# Patient Record
Sex: Male | Born: 1975 | Hispanic: No | Marital: Single | Smoking: Current every day smoker
Health system: Southern US, Community
[De-identification: ages and names within clinical notes are randomized; demographics above are authoritative.]

---

## 2007-11-20 ENCOUNTER — Emergency Department: Payer: Self-pay | Admitting: Emergency Medicine

## 2015-08-16 ENCOUNTER — Emergency Department: Payer: Self-pay

## 2015-08-16 ENCOUNTER — Emergency Department
Admission: EM | Admit: 2015-08-16 | Discharge: 2015-08-16 | Disposition: A | Discharge status: Home / Self Care | Payer: Self-pay | Attending: Emergency Medicine | Admitting: Emergency Medicine

## 2015-08-16 DIAGNOSIS — W228XXA Striking against or struck by other objects, initial encounter: Secondary | ICD-10-CM | POA: Insufficient documentation

## 2015-08-16 DIAGNOSIS — Y9364 Activity, baseball: Secondary | ICD-10-CM | POA: Insufficient documentation

## 2015-08-16 DIAGNOSIS — Y92310 Basketball court as the place of occurrence of the external cause: Secondary | ICD-10-CM | POA: Insufficient documentation

## 2015-08-16 DIAGNOSIS — Y998 Other external cause status: Secondary | ICD-10-CM | POA: Insufficient documentation

## 2015-08-16 DIAGNOSIS — S43101A Unspecified dislocation of right acromioclavicular joint, initial encounter: Secondary | ICD-10-CM | POA: Insufficient documentation

## 2015-08-16 MED ORDER — OXYCODONE-ACETAMINOPHEN 5-325 MG PO TABS: 1.0000 | ORAL_TABLET | ORAL | Status: DC | PRN

## 2015-08-16 MED ORDER — OXYCODONE-ACETAMINOPHEN 5-325 MG PO TABS
1.0000 | ORAL_TABLET | Freq: Once | ORAL | Status: AC
  Administered 2015-08-16: 1 via ORAL
  Filled 2015-08-16: qty 1

## 2015-08-16 NOTE — ED Provider Notes (Signed)
[facility] Emergency Department Provider Note  ____________________________________________  Time seen: Approximately 5:37 PM  I have reviewed the triage vital signs and the nursing notes.   HISTORY  Chief Complaint Shoulder Injury   HPI Grant Burton is a 40 y.o. male chief complaint of right shoulder pain. Patient was playing softball and felt himself losing his balance and rolled hitting his right shoulder on the ground. He states at that time he felt a pop and has felt abnormal sense. Patient has had decreased range of motion in his right shoulder but is able to move his fingers and denies paresthesias. He denies any head injury or loss consciousness. He has not taken any medication prior to his arrival in the emergency room. Currently he rates his pain as an 8 out of 10.  No past medical history on file.  There are no active problems to display for this patient.   No past surgical history on file.  Current Outpatient Rx  Name  Route  Sig  Dispense  Refill  . oxyCODONE-acetaminophen (PERCOCET) 5-325 MG tablet   Oral   Take 1-2 tablets by mouth every 4 (four) hours as needed for severe pain.   30 tablet   0     Allergies Review of patient's allergies indicates no known allergies.  No family history on file.  Social History Social History  Substance Use Topics  . Smoking status: Not on file  . Smokeless tobacco: Not on file  . Alcohol Use: Not on file    Review of Systems Constitutional: No fever/chills ENT: No trauma Cardiovascular: Denies chest pain. Respiratory: Denies shortness of breath. Gastrointestinal:   No nausea, no vomiting.   Musculoskeletal: Positive for right shoulder pain. Skin: Negative for rash. Neurological: Negative for headaches, focal weakness or numbness.  10-point ROS otherwise negative.  ____________________________________________   PHYSICAL EXAM:  VITAL SIGNS: ED Triage Vitals  Enc Vitals Group    BP 08/16/15 1651 148/81 mmHg     Pulse Rate 08/16/15 1651 93     Resp 08/16/15 1651 18     Temp 08/16/15 1651 98 F (36.7 C)     Temp Source 08/16/15 1651 Oral     SpO2 08/16/15 1651 96 %     Weight 08/16/15 1651 209 lb (94.802 kg)     Height 08/16/15 1651 5\' 7"  (1.702 m)     Head Cir --      Peak Flow --      Pain Score 08/16/15 1652 8     Pain Loc --      Pain Edu? --      Excl. in GC? --     Constitutional: Alert and oriented. Well appearing and in no acute distress. Eyes: Conjunctivae are normal. PERRL. EOMI. Head: Atraumatic. Nose: No congestion/rhinnorhea. Neck: No stridor.   Cardiovascular: Normal rate, regular rhythm. Grossly normal heart sounds.  Good peripheral circulation. Respiratory: Normal respiratory effort.  No retractions. Lungs CTAB. Musculoskeletal: Right shoulder there is some soft tissue swelling and moderate tenderness on palpation at the [facility].  Range of motion is restricted secondary to pain. Motor sensory function distal to the injury is within normal limits. Neurologic:  Normal speech and language. No gross focal neurologic deficits are appreciated. No gait instability. Skin:  Skin is warm, dry and intact. No rash noted. No ecchymosis, abrasions, erythema was noted to the right shoulder. Psychiatric: Mood and affect are normal. Speech and behavior are normal.  ____________________________________________   LABS (  all labs ordered are listed, but only abnormal results are displayed)  Labs Reviewed - No data to display  RADIOLOGY  Right shoulder per radiologist shows type III before meals joint separation. ____________________________________________   PROCEDURES  Procedure(s) performed: None  Critical Care performed: No  ____________________________________________   INITIAL IMPRESSION / ASSESSMENT AND PLAN / ED COURSE  Pertinent labs & imaging results that were available during my care of the patient were reviewed by me and considered  in my medical decision making (see chart for details).  Patient was given Percocet while in the emergency room and a prescription for the same. He opted to wear a sling rather than a shoulder immobilizer. He was instructed to call Dr. Binnie Rail office tomorrow for an appointment. Ice and elevation. ____________________________________________   FINAL CLINICAL IMPRESSION(S) / ED DIAGNOSES  Final diagnoses:  AC separation, type 3, right, initial encounter      Tommi Rumps, PA-C 08/16/15 1827  Phineas Semen, MD 08/16/15 2010

## 2015-08-16 NOTE — Discharge Instructions (Signed)
Shoulder Separation A shoulder separation (acromioclavicular separation) is an injury to the connecting tissue (ligament) between the top of your shoulder blade (acromion) and your collarbone (clavicle). The ligament may be stretched, partially torn, or completely torn.  A stretched ligament may not cause very much pain, and it does not move the collarbone out of place. A stretched ligament looks normal on an X-ray.  An injury that is a bit worse may partially tear a ligament and move the collarbone slightly out of place.  A serious injury completely tears both shoulder ligaments. This moves the collarbone severely out of position and changes the way that the shoulder looks (deformity). CAUSES The most common cause of a shoulder separation is falling on or receiving a blow to the top of the shoulder. Falling with an outstretched arm may also cause this injury. RISK FACTORS You may be at greater risk of a shoulder separation if:  You are male.  You are younger than age 72.  You play a contact sport, such as football or hockey. SIGNS AND SYMPTOMS The most common symptom of a shoulder separation is pain on the top of the shoulder after falling on it or receiving a blow to it. Other signs and symptoms include:  Shoulder deformity.  Swelling of the shoulder.  Decreased ability to move the shoulder.  Bruising on top of the shoulder. DIAGNOSIS Your health care provider may suspect a shoulder separation based on your symptoms and the details of a recent injury. A physical exam will be done. During this exam, the health care provider may:  Press on your shoulder.  Test the movement of your shoulder.  Ask you to hold a weight in your hand to see if the separation increases.  Do an X-ray. TREATMENT  A stretch injury may require only a sling, pain medicine, and cold packs. This treatment may last for 2-12 weeks. You may also have physical therapy. A physical therapist will teach you to  do daily exercises to strengthen your shoulder muscles and prevent stiffness.  A complete tear may require surgery to repair the torn ligament. After surgery, you will also require a sling, pain medicine, and cold packs. Recovery may take longer. You may also need more physical therapy. HOME CARE INSTRUCTIONS  Take medicines only as directed by your health care provider.  Apply ice to the top of your shoulder:  Put ice in a plastic bag.  Place a towel between your skin and the bag.  Leave the ice on for 20 minutes, 2-3 times a day.  Wear your sling or splint as directed by your health care provider.  You may be able to remove your sling to do your physical therapy exercises.  Ask your health care provider when you can stop wearing the sling.  Do not do any activities that make your pain worse.  Do not lift anything that is heavier than 10 lb (4.5 kg) on the injured side of your body.  Ask your health care provider when you can return to athletic activities. SEEK MEDICAL CARE IF:  Your pain medicine is not relieving your pain.  Your pain and stiffness are not improving after 2 weeks.  You are unable to do your physical therapy exercises because of pain or stiffness.   This information is not intended to replace advice given to you by your health care provider. Make sure you discuss any questions you have with your health care provider.   Document Released: 02/23/2005 Document Revised: 06/06/2014  Document Reviewed: 10/16/2013 Elsevier Interactive Patient Education [employer]   Follow-up with Dr. Joice LoftsPoggi. Call the office tomorrow for appointment information. Ice and elevate, wear sling for support. Percocet as needed for pain as directed.

## 2015-08-16 NOTE — ED Notes (Signed)
Pt states was playing softball and fell rolling on the rt shoulder, pt thinks that his rt shoulder may be out of joint, pt states that the shoulder feels abnormal, radial pulse palpable, pt able to move fingers and has decreased movement to the rt shoulder

## 2015-08-16 NOTE — ED Notes (Signed)
Pt refused pain medication, pt accepted ice pack

## 2018-05-10 ENCOUNTER — Emergency Department
Admission: EM | Admit: 2018-05-10 | Discharge: 2018-05-10 | Disposition: A | Discharge status: Home / Self Care | Payer: 59 | Attending: Emergency Medicine | Admitting: Emergency Medicine

## 2018-05-10 ENCOUNTER — Encounter: Payer: Self-pay | Admitting: Emergency Medicine

## 2018-05-10 ENCOUNTER — Emergency Department: Payer: 59

## 2018-05-10 ENCOUNTER — Other Ambulatory Visit: Payer: Self-pay

## 2018-05-10 DIAGNOSIS — S0083XA Contusion of other part of head, initial encounter: Secondary | ICD-10-CM | POA: Insufficient documentation

## 2018-05-10 DIAGNOSIS — F17210 Nicotine dependence, cigarettes, uncomplicated: Secondary | ICD-10-CM | POA: Insufficient documentation

## 2018-05-10 DIAGNOSIS — S0990XA Unspecified injury of head, initial encounter: Secondary | ICD-10-CM | POA: Diagnosis present

## 2018-05-10 DIAGNOSIS — Y999 Unspecified external cause status: Secondary | ICD-10-CM | POA: Diagnosis not present

## 2018-05-10 DIAGNOSIS — W19XXXA Unspecified fall, initial encounter: Secondary | ICD-10-CM | POA: Insufficient documentation

## 2018-05-10 DIAGNOSIS — Y929 Unspecified place or not applicable: Secondary | ICD-10-CM | POA: Diagnosis not present

## 2018-05-10 DIAGNOSIS — Y939 Activity, unspecified: Secondary | ICD-10-CM | POA: Insufficient documentation

## 2018-05-10 DIAGNOSIS — R55 Syncope and collapse: Secondary | ICD-10-CM | POA: Insufficient documentation

## 2018-05-10 DIAGNOSIS — T07XXXA Unspecified multiple injuries, initial encounter: Secondary | ICD-10-CM

## 2018-05-10 NOTE — ED Notes (Signed)
See triage note  States he thinks he passed out after coughing hard  Hit his head on cement

## 2018-05-10 NOTE — Discharge Instructions (Addendum)
[place] [facility] [place] [facility] [place] if any continued problems.  Continue trying to decrease the amount of smoking that you are doing.  You may also try medications like Mucinex DM as needed for cough as this will help loosen the secretions that you are trying to cough up.  Drink lots of fluids.

## 2018-05-10 NOTE — ED Provider Notes (Signed)
[facility] Emergency Department Provider Note   ____________________________________________   None    (approximate)  I have reviewed the triage vital signs and the nursing notes.   HISTORY  Chief Complaint Loss of Consciousness and Cough   HPI Grant Burton is a 42 y.o. male presents to the ED for evaluation of a minor head injury.  Patient states that he was coughing very hard as he has a smoker.  He states that during this time he passed out hitting his head on concrete.  This was observed by his supervisor while he was at work.  Patient states that each morning he coughs like this because he is a smoker.  He denies any fever, chills, nausea or vomiting.  There is been no change in his vision.  He also has several abrasions to his face.  His pain is 2/10.   History reviewed. No pertinent past medical history.  There are no active problems to display for this patient.   History reviewed. No pertinent surgical history.  Prior to Admission medications   Not on File    Allergies Patient has no known allergies.  No family history on file.  Social History Social History   Tobacco Use  . Smoking status: Current Every Day Smoker    Packs/day: 1.00    Types: Cigarettes  . Smokeless tobacco: Never Used  Substance Use Topics  . Alcohol use: Never    Frequency: Never  . Drug use: Not on file    Review of Systems Constitutional: No fever/chills Eyes: No visual changes. ENT: Negative for nasal congestion, sore throat, nasal pain. Cardiovascular: Denies chest pain. Respiratory: Denies shortness of breath.  Positive for nonproductive cough. Gastrointestinal: No abdominal pain.  No nausea, no vomiting.  Musculoskeletal: Negative for back pain. Skin: Positive for multiple abrasions to face. Neurological: Negative for headaches, focal weakness or numbness. ____________________________________________   PHYSICAL EXAM:  VITAL SIGNS: ED  Triage Vitals  Enc Vitals Group     BP 05/10/18 1111 (!) 149/87     Pulse Rate 05/10/18 1111 73     Resp 05/10/18 1111 16     Temp 05/10/18 1111 97.9 F (36.6 C)     Temp Source 05/10/18 1111 Oral     SpO2 05/10/18 1111 93 %     Weight 05/10/18 1059 209 lb (94.8 kg)     Height 05/10/18 1059 5\' 5"  (1.651 m)     Head Circumference --      Peak Flow --      Pain Score 05/10/18 1059 2     Pain Loc --      Pain Edu? --      Excl. in GC? --    Constitutional: Alert and oriented. Well appearing and in no acute distress. Eyes: Conjunctivae are normal. PERRL. EOMI. Head: Atraumatic. Nose: No trauma. Mouth/Throat: Mucous membranes are moist.  Oropharynx non-erythematous.  No dental injury. Neck: No stridor.  No point tenderness on palpation of cervical spine posteriorly. Cardiovascular: Normal rate, regular rhythm. Grossly normal heart sounds.  Good peripheral circulation. Respiratory: Normal respiratory effort.  No retractions. Lungs CTAB. Musculoskeletal: No injury to the upper or lower extremities.  Nontender to palpation.  Patient is ambulatory without any assistance and normal gait was noted. Neurologic:  Normal speech and language. No gross focal neurologic deficits are appreciated. No gait instability. Skin:  Skin is warm, dry.  There are multiple superficial abrasions noted to the left side of his face  and forehead.  No foreign body is noted.  No active bleeding is present. Psychiatric: Mood and affect are normal. Speech and behavior are normal.  ____________________________________________   LABS (all labs ordered are listed, but only abnormal results are displayed)  Labs Reviewed - No data to display RADIOLOGY  Official radiology report(s): Ct Head Wo Contrast  Result Date: 05/10/2018 CLINICAL DATA:  Syncopal episode today with a fall and blow to the head. EXAM: CT HEAD WITHOUT CONTRAST TECHNIQUE: Contiguous axial images were obtained from the base of the skull through the  vertex without intravenous contrast. COMPARISON:  Head CT scan 11/20/2007. FINDINGS: Brain: No evidence of acute infarction, hemorrhage, hydrocephalus, extra-axial collection or mass lesion/mass effect. Vascular: No hyperdense vessel or unexpected calcification. Skull: Intact.  No focal lesion. Sinuses/Orbits: Minimal mucosal thickening periphery of the left maxillary sinus noted. Otherwise negative. Other: None. IMPRESSION: Negative head CT. Electronically Signed   By: Drusilla Kanner M.D.   On: 05/10/2018 11:22    ____________________________________________   PROCEDURES  Procedure(s) performed: None  Procedures  Critical Care performed: No  ____________________________________________   INITIAL IMPRESSION / ASSESSMENT AND PLAN / ED COURSE  As part of my medical decision making, I reviewed the following data within the electronic MEDICAL RECORD NUMBER Notes from prior ED visits and [place] Controlled Substance Database  Patient presents to the ED valuation of his head injury.  Patient states that he was coughing as he does each morning and reportedly passed out.  Patient states that he has not had any respiratory issues but continues to cough secondary to his cigarette smoking.  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  CT scan was negative and patient was made aware.  He is also instructed to clean the areas on his face with mild soap and water and watch for any signs of infection.  Patient requested a note to be able to return to work.  ____________________________________________   FINAL CLINICAL IMPRESSION(S) / ED DIAGNOSES  Final diagnoses:  Contusion of face, initial encounter  Multiple abrasions  Vasovagal syncope     ED Discharge Orders    None       Note:  This document was prepared using Dragon voice recognition software and may include unintentional dictation errors.    Tommi Rumps, PA-C 05/10/18 1637    Schaevitz, Myra Rude,  MD 05/11/18 [phone]

## 2018-05-10 NOTE — ED Triage Notes (Signed)
Pt here with c/o coughing so hard at work he passed out, hit his head on the concrete, knot noted to left temple, abrasion and bruise/swelling noted beside left eye, denies any trouble with vision. Pain at impact, no headache at this time, denies blood thinners.

## 2019-11-02 IMAGING — CT CT HEAD W/O CM
3 series · 16 of 47 positions shown, 19 images · non-contrast
Comparison: Head CT scan 11/20/2007.

CLINICAL DATA: Syncopal episode today with a fall and blow to the
head.

EXAM:
CT HEAD WITHOUT CONTRAST
TECHNIQUE: Contiguous axial images were obtained from the base of the skull
through the vertex without intravenous contrast.

[Series 3: head wo · axial · 0.41mm/px · z∈[-108,+17]mm · 10 of 30 slices shown, 13 images]
[im 3/30  brain]
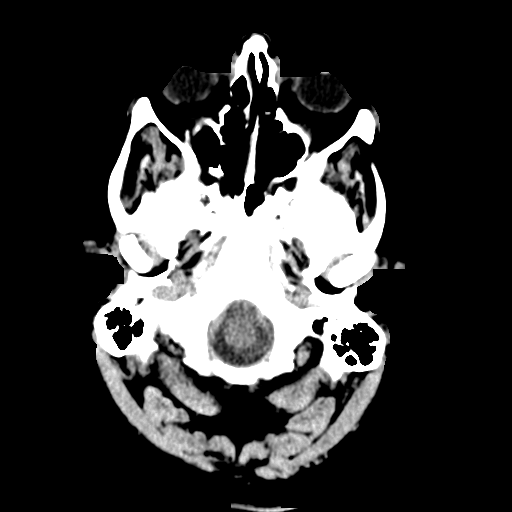
[im 3/30  bone]
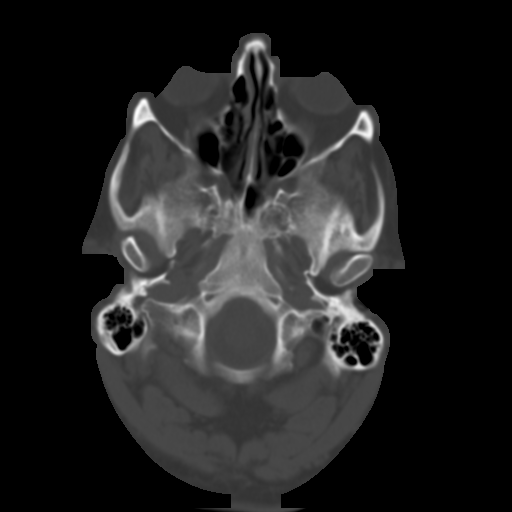
[im 6/30  brain]
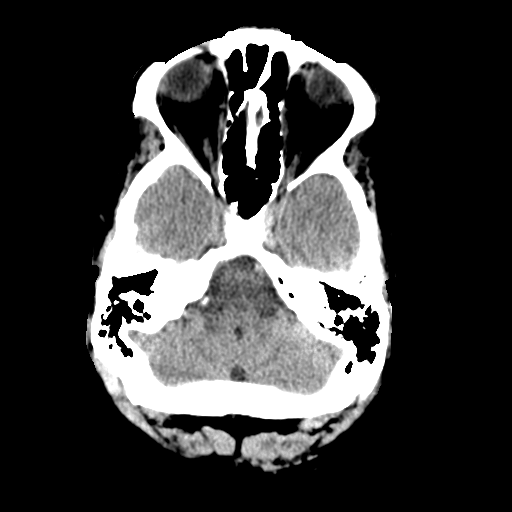
[im 9/30  brain]
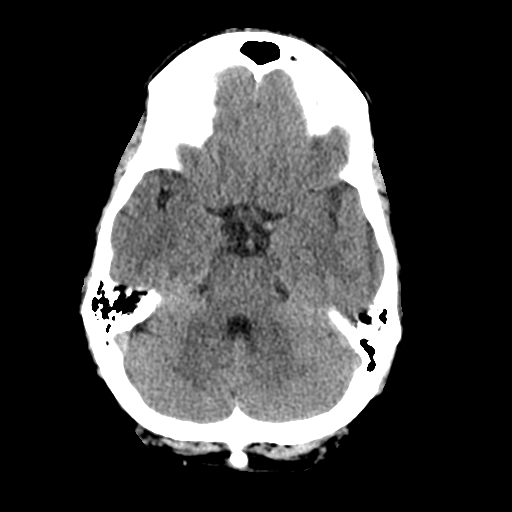
[im 11/30  brain]
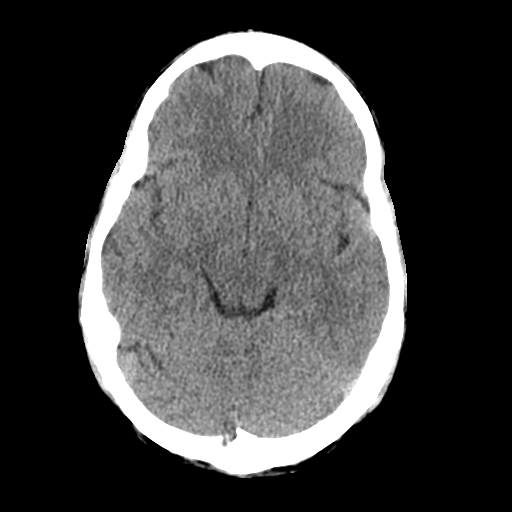
[im 14/30  brain]
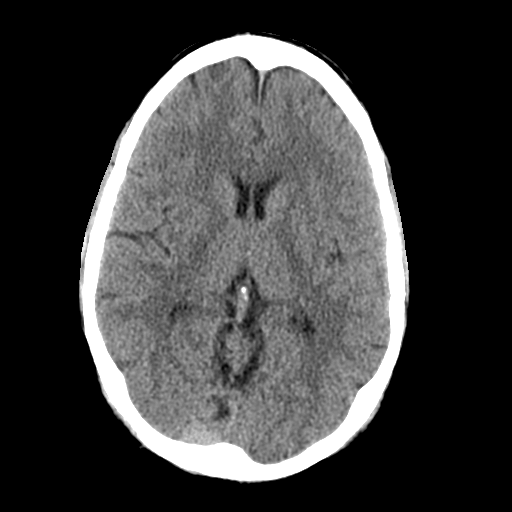
[im 14/30  bone]
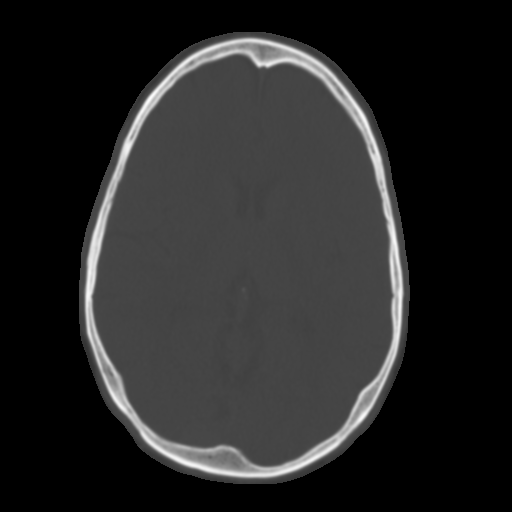
[im 17/30  brain]
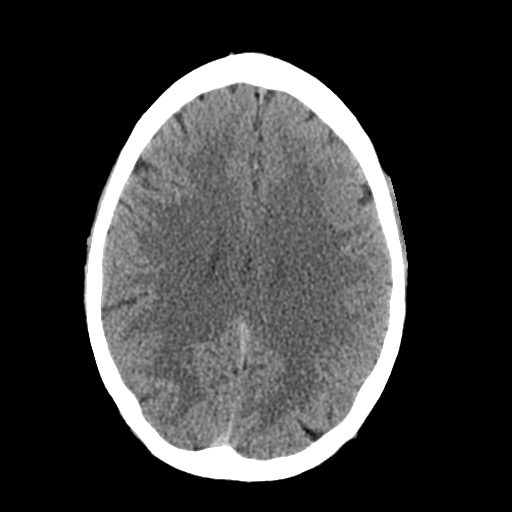
[im 20/30  brain]
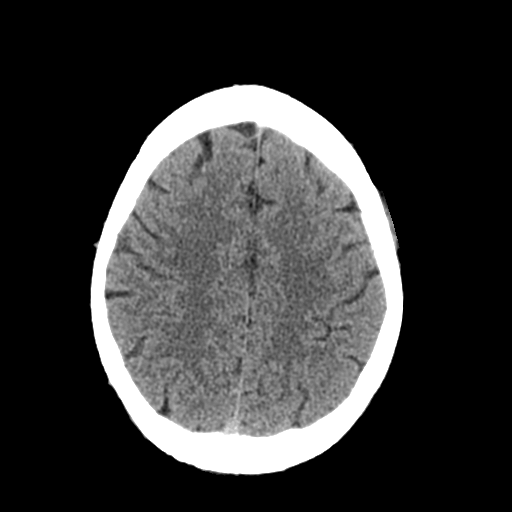
[im 23/30  brain]
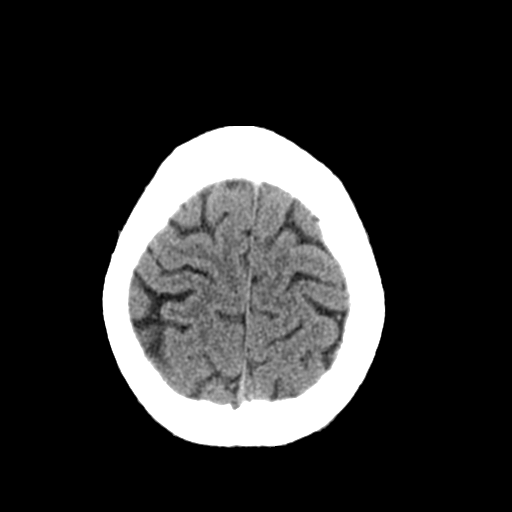
[im 25/30  brain]
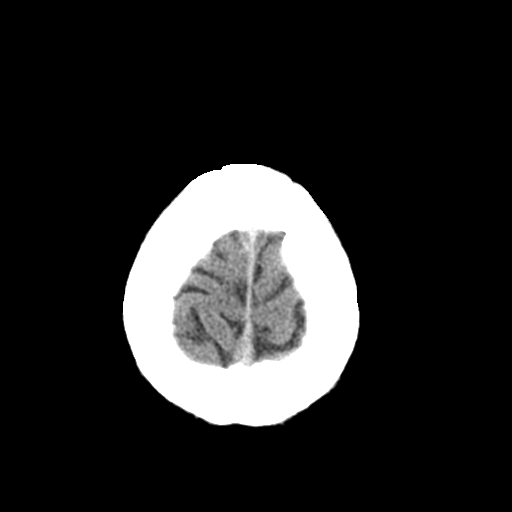
[im 25/30  bone]
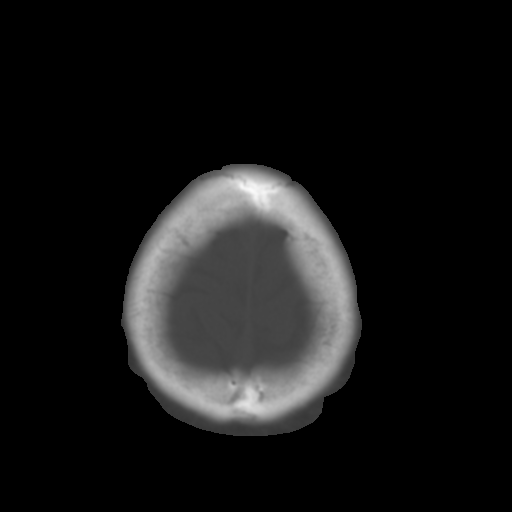
[im 28/30  brain]
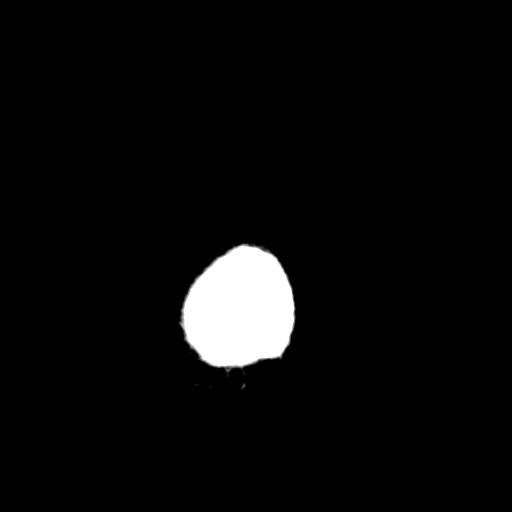

[Series 4: coronal soft tissue · coronal · 0.31mm/px · 3 of 67 slices shown]
[im 23/67  brain]
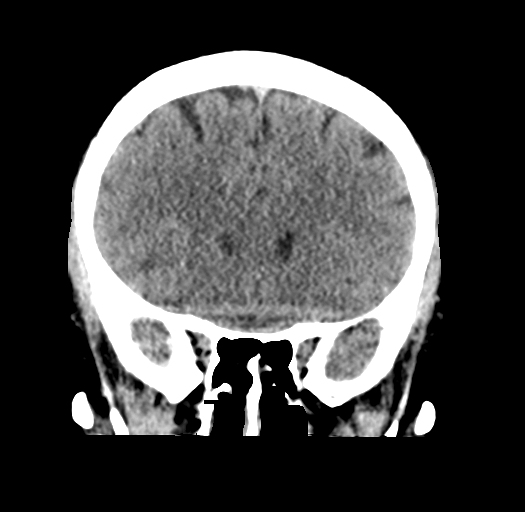
[im 30/67  brain]
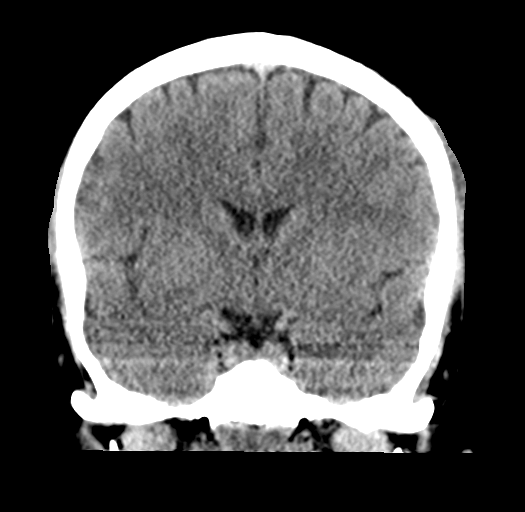
[im 37/67  brain]
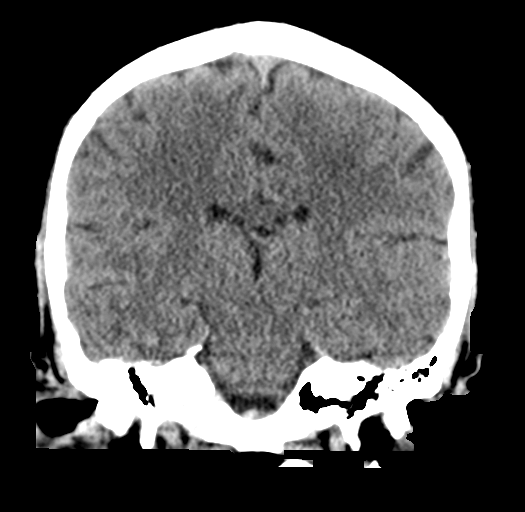

[Series 5: sagittal soft tissue · sagittal · 0.31mm/px · 3 of 51 slices shown]
[im 17/51  brain]
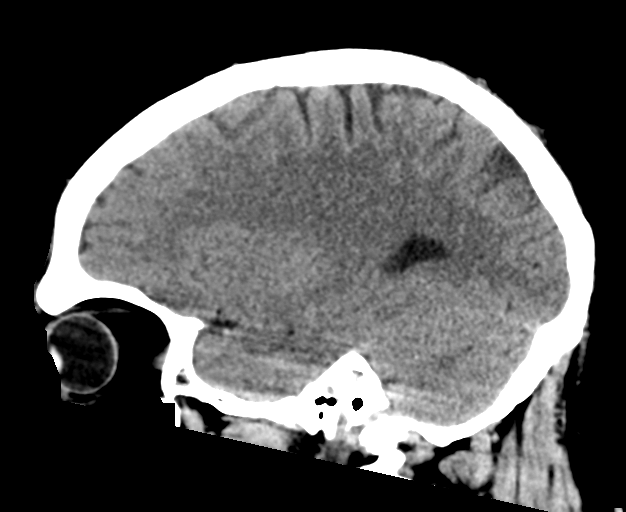
[im 26/51  brain]
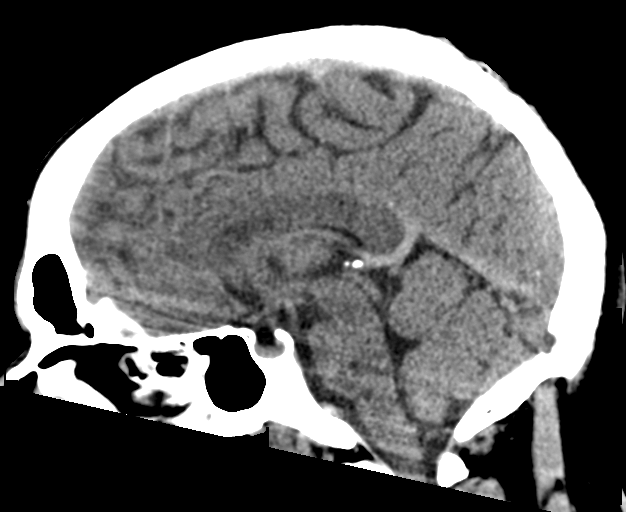
[im 34/51  brain]
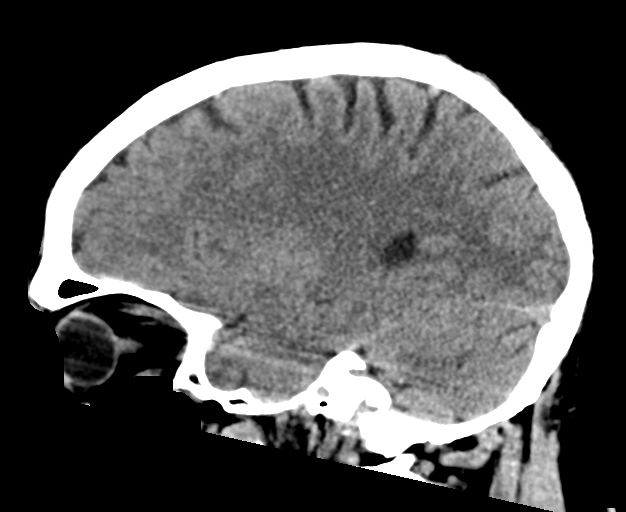

[16 of 47 positions shown; findings below may reference images not displayed]

FINDINGS: Brain: No evidence of acute infarction, hemorrhage, hydrocephalus,
extra-axial collection or mass lesion/mass effect.

Vascular: No hyperdense vessel or unexpected calcification.

Skull: Intact.  No focal lesion.

Sinuses/Orbits: Minimal mucosal thickening periphery of the left
maxillary sinus noted. Otherwise negative.

Other: None.
IMPRESSION: Negative head CT.
